# Patient Record
Sex: Female | Born: 1950 | Race: White | Hispanic: No | Marital: Married
Health system: Southern US, Community
[De-identification: ages and names within clinical notes are randomized; demographics above are authoritative.]

## PROBLEM LIST (undated history)

## (undated) DIAGNOSIS — R002 Palpitations: Secondary | ICD-10-CM

## (undated) DIAGNOSIS — E039 Hypothyroidism, unspecified: Secondary | ICD-10-CM

## (undated) HISTORY — DX: Palpitations: R00.2

## (undated) HISTORY — DX: Hypothyroidism, unspecified: E03.9

## (undated) HISTORY — PX: TONSILLECTOMY: SUR1361

---

## 2008-07-30 HISTORY — PX: VAGINAL HYSTERECTOMY: SHX2639

## 2008-07-30 HISTORY — PX: INCONTINENCE SURGERY: SHX676

## 2016-08-02 ENCOUNTER — Other Ambulatory Visit: Payer: Self-pay | Admitting: Internal Medicine

## 2016-08-02 DIAGNOSIS — Z1231 Encounter for screening mammogram for malignant neoplasm of breast: Secondary | ICD-10-CM

## 2016-09-04 ENCOUNTER — Ambulatory Visit: Payer: Self-pay | Attending: Internal Medicine

## 2017-12-10 ENCOUNTER — Encounter: Payer: Self-pay | Admitting: Obstetrics & Gynecology

## 2017-12-10 ENCOUNTER — Ambulatory Visit (INDEPENDENT_AMBULATORY_CARE_PROVIDER_SITE_OTHER): Payer: BLUE CROSS/BLUE SHIELD | Admitting: Obstetrics & Gynecology

## 2017-12-10 ENCOUNTER — Other Ambulatory Visit: Payer: Self-pay | Admitting: Obstetrics & Gynecology

## 2017-12-10 VITALS — BP 107/72 | HR 70 | Wt 123.0 lb

## 2017-12-10 DIAGNOSIS — N951 Menopausal and female climacteric states: Secondary | ICD-10-CM

## 2017-12-10 DIAGNOSIS — N644 Mastodynia: Secondary | ICD-10-CM

## 2017-12-10 DIAGNOSIS — Z1231 Encounter for screening mammogram for malignant neoplasm of breast: Secondary | ICD-10-CM

## 2017-12-10 DIAGNOSIS — Z01419 Encounter for gynecological examination (general) (routine) without abnormal findings: Secondary | ICD-10-CM | POA: Diagnosis not present

## 2017-12-10 NOTE — Progress Notes (Signed)
Last mammogram 2016 Pap smear?

## 2017-12-10 NOTE — Progress Notes (Signed)
GYNECOLOGY ANNUAL PREVENTATIVE CARE ENCOUNTER NOTE  Subjective:   Crystal Torres is a 67 y.o. G3P2013 female here for a routine annual gynecologic exam.  Current complaints: vaginal dryness which causes pain during intercourse.   Denies abnormal vaginal bleeding, discharge, pelvic pain or other gynecologic concerns.    Gynecologic History No LMP recorded. Contraception: status post hysterectomy Last mammogram: 2016. Results were: normal  Obstetric History OB History  Gravida Para Term Preterm AB Living  SAB TAB Ectopic Multiple Live Births          3    # Outcome Date GA Lbr Len/2nd Weight Sex Delivery Anes PTL Lv  3 AB           2 Term           1 Term             Past Medical History:  Diagnosis Date  . Heart palpitations    On beta blocker  . Hypothyroidism     Past Surgical History:  Procedure Laterality Date  . INCONTINENCE SURGERY  2010   Done at same time as TVH  . TONSILLECTOMY Bilateral    Done at age 73  . VAGINAL HYSTERECTOMY  2010   TVH, RSO at [facility] for benign reasons    Current Outpatient Medications on File Prior to Visit  Medication Sig Dispense Refill  . Cyanocobalamin ER (B-12 DUAL SPECTRUM) 5000 MCG TBCR Take by mouth.    . ferrous sulfate 325 (65 FE) MG tablet Take by mouth.    . levothyroxine (SYNTHROID, LEVOTHROID) 75 MCG tablet Take by mouth.    . metoprolol succinate (TOPROL-XL) 50 MG 24 hr tablet Take 50 mg by mouth daily.  3  . Potassium 99 MG TABS Take by mouth.     No current facility-administered medications on file prior to visit.     Allergies  Allergen Reactions  . Hydrocodone-Acetaminophen Other (See Comments) and Itching    Social History   Socioeconomic History  . Marital status: Married    Spouse name: Not on file  . Number of children: Not on file  . Years of education: Not on file  . Highest education level: Not on file  Occupational History  . Not on file  Social Needs  .  Financial resource strain: Not on file  . Food insecurity:    Worry: Not on file    Inability: Not on file  . Transportation needs:    Medical: Not on file    Non-medical: Not on file  Tobacco Use  . Smoking status: Not on file  Substance and Sexual Activity  . Alcohol use: Not on file  . Drug use: Not on file  . Sexual activity: Not on file  Lifestyle  . Physical activity:    Days per week: Not on file    Minutes per session: Not on file  . Stress: Not on file  Relationships  . Social connections:    Talks on phone: Not on file    Gets together: Not on file    Attends religious service: Not on file    Active member of club or organization: Not on file    Attends meetings of clubs or organizations: Not on file    Relationship status: Not on file  . Intimate partner violence:    Fear of current or ex partner: Not on file    Emotionally abused: Not on  file    Physically abused: Not on file    Forced sexual activity: Not on file  Other Topics Concern  . Not on file  Social History Narrative  . Not on file    No family history on file.  The following portions of the patient's history were reviewed and updated as appropriate: allergies, current medications, past family history, past medical history, past social history, past surgical history and problem list.  Review of Systems Pertinent items noted in HPI and remainder of comprehensive ROS otherwise negative.   Objective:  BP 107/72   Pulse 70   Wt 123 lb (55.8 kg)  CONSTITUTIONAL: Well-developed, well-nourished female in no acute distress.  HENT:  Normocephalic, atraumatic, External right and left ear normal. Oropharynx is clear and moist EYES: Conjunctivae and EOM are normal. Pupils are equal, round, and reactive to light. No scleral icterus.  NECK: Normal range of motion, supple, no masses.  Normal thyroid.  SKIN: Skin is warm and dry. No rash noted. Not diaphoretic. No erythema. No pallor. NEUROLOGIC: Alert and  oriented to person, place, and time. Normal reflexes, muscle tone coordination. No cranial nerve deficit noted. PSYCHIATRIC: Normal mood and affect. Normal behavior. Normal judgment and thought content. CARDIOVASCULAR: Normal heart rate noted, regular rhythm RESPIRATORY: Clear to auscultation bilaterally. Effort and breath sounds normal, no problems with respiration noted. BREASTS: Symmetric in size. No masses, skin changes, nipple drainage, or lymphadenopathy. ABDOMEN: Soft, normal bowel sounds, no distention noted.  No tenderness, rebound or guarding.  PELVIC: Atrophic external genitalia and vaginal mucosa and cuff.  No abnormal discharge noted. No adnexal tenderness. MUSCULOSKELETAL: Normal range of motion. No tenderness.  No cyanosis, clubbing, or edema.  2+ distal pulses.   Assessment and Plan:  1. Menopausal vaginal dryness Discussed management of this in detail.  Recommended  Topical estrogen vs Osphena vs Intrarosa. Samples of Osphena and Intrarosa given. Recommended water-based or silicone-based hypoallergenic lubrication during intercourse.  Follow up in one month.  2. Encounter for screening mammogram for breast cancer Mammogram ordered  3. Well woman exam No need for paps given hysterectomy for benign indications. Routine preventative health maintenance measures emphasized. Please refer to After Visit Summary for other counseling recommendations.    Jaynie Collins, MD, FACOG Obstetrician & Gynecologist, [facility] for [employer], [facility]

## 2017-12-10 NOTE — Patient Instructions (Signed)
Preventive Care 40-64 Years, Female Preventive care refers to lifestyle choices and visits with your health care provider that can promote health and wellness. What does preventive care include?  A yearly physical exam. This is also called an annual well check.  Dental exams once or twice a year.  Routine eye exams. Ask your health care provider how often you should have your eyes checked.  Personal lifestyle choices, including: ? Daily care of your teeth and gums. ? Regular physical activity. ? Eating a healthy diet. ? Avoiding tobacco and drug use. ? Limiting alcohol use. ? Practicing safe sex. ? Taking low-dose aspirin daily starting at age 58. ? Taking vitamin and mineral supplements as recommended by your health care provider. What happens during an annual well check? The services and screenings done by your health care provider during your annual well check will depend on your age, overall health, lifestyle risk factors, and family history of disease. Counseling Your health care provider may ask you questions about your:  Alcohol use.  Tobacco use.  Drug use.  Emotional well-being.  Home and relationship well-being.  Sexual activity.  Eating habits.  Work and work Statistician.  Method of birth control.  Menstrual cycle.  Pregnancy history.  Screening You may have the following tests or measurements:  Height, weight, and BMI.  Blood pressure.  Lipid and cholesterol levels. These may be checked every 5 years, or more frequently if you are over 81 years old.  Skin check.  Lung cancer screening. You may have this screening every year starting at age 78 if you have a 30-pack-year history of smoking and currently smoke or have quit within the past 15 years.  Fecal occult blood test (FOBT) of the stool. You may have this test every year starting at age 65.  Flexible sigmoidoscopy or colonoscopy. You may have a sigmoidoscopy every 5 years or a colonoscopy  every 10 years starting at age 30.  Hepatitis C blood test.  Hepatitis B blood test.  Sexually transmitted disease (STD) testing.  Diabetes screening. This is done by checking your blood sugar (glucose) after you have not eaten for a while (fasting). You may have this done every 1-3 years.  Mammogram. This may be done every 1-2 years. Talk to your health care provider about when you should start having regular mammograms. This may depend on whether you have a family history of breast cancer.  BRCA-related cancer screening. This may be done if you have a family history of breast, ovarian, tubal, or peritoneal cancers.  Pelvic exam and Pap test. This may be done every 3 years starting at age 80. Starting at age 36, this may be done every 5 years if you have a Pap test in combination with an HPV test.  Bone density scan. This is done to screen for osteoporosis. You may have this scan if you are at high risk for osteoporosis.  Discuss your test results, treatment options, and if necessary, the need for more tests with your health care provider. Vaccines Your health care provider may recommend certain vaccines, such as:  Influenza vaccine. This is recommended every year.  Tetanus, diphtheria, and acellular pertussis (Tdap, Td) vaccine. You may need a Td booster every 10 years.  Varicella vaccine. You may need this if you have not been vaccinated.  Zoster vaccine. You may need this after age 5.  Measles, mumps, and rubella (MMR) vaccine. You may need at least one dose of MMR if you were born in  1955 or later. You may also need a second dose.  Pneumococcal 13-valent conjugate (PCV13) vaccine. You may need this if you have certain conditions and were not previously vaccinated.  Pneumococcal polysaccharide (PPSV23) vaccine. You may need one or two doses if you smoke cigarettes or if you have certain conditions.  Meningococcal vaccine. You may need this if you have certain  conditions.  Hepatitis A vaccine. You may need this if you have certain conditions or if you travel or work in places where you may be exposed to hepatitis A.  Hepatitis B vaccine. You may need this if you have certain conditions or if you travel or work in places where you may be exposed to hepatitis B.  Haemophilus influenzae type b (Hib) vaccine. You may need this if you have certain conditions.  Talk to your health care provider about which screenings and vaccines you need and how often you need them. This information is not intended to replace advice given to you by your health care provider. Make sure you discuss any questions you have with your health care provider. Document Released: 08/12/2015 Document Revised: 04/04/2016 Document Reviewed: 05/17/2015 Elsevier Interactive Patient Education  2018 [employer]  

## 2017-12-16 ENCOUNTER — Ambulatory Visit
Admission: RE | Admit: 2017-12-16 | Discharge: 2017-12-16 | Disposition: A | Discharge status: Home / Self Care | Payer: PRIVATE HEALTH INSURANCE | Source: Ambulatory Visit | Attending: Obstetrics & Gynecology | Admitting: Obstetrics & Gynecology

## 2017-12-16 ENCOUNTER — Ambulatory Visit: Payer: PRIVATE HEALTH INSURANCE

## 2017-12-16 DIAGNOSIS — N644 Mastodynia: Secondary | ICD-10-CM

## 2017-12-16 IMAGING — MG DIGITAL DIAGNOSTIC BILATERAL MAMMOGRAM WITH TOMO AND CAD
8 series · 9 of 24 positions shown · non-contrast
Comparison: Previous exam(s).

CLINICAL DATA: [Y4] old female with chronic
discomfort/tingling/pins and needle sensation in the lateral left
breast and surrounding the left nipple. The patient states this
sensation/pain initially started after an episode of mastitis nearly
30 years ago.

EXAM:
DIGITAL DIAGNOSTIC BILATERAL MAMMOGRAM WITH CAD AND TOMO

[R MLO synth-2D]
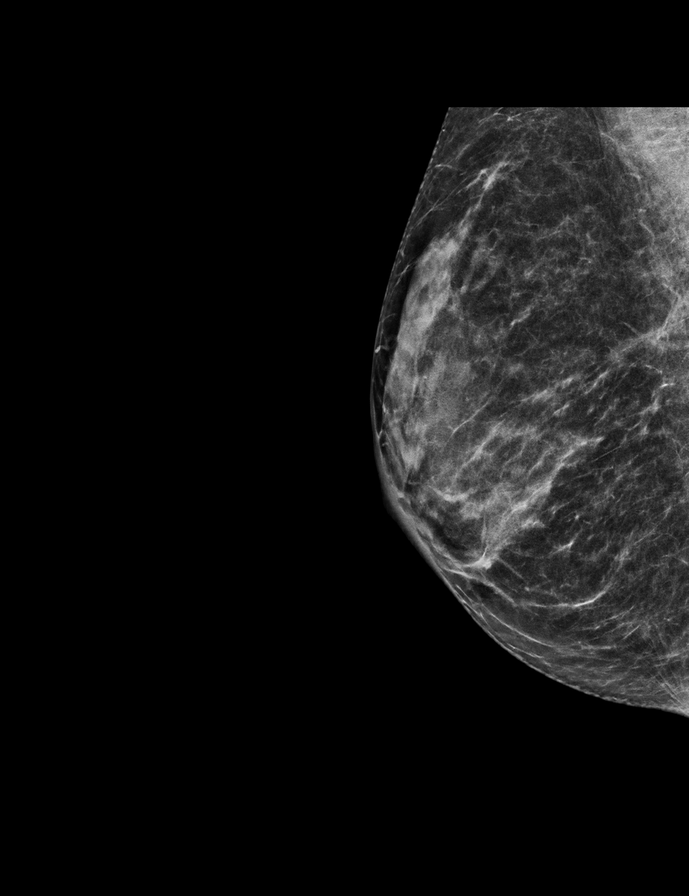

[R CC synth-2D]
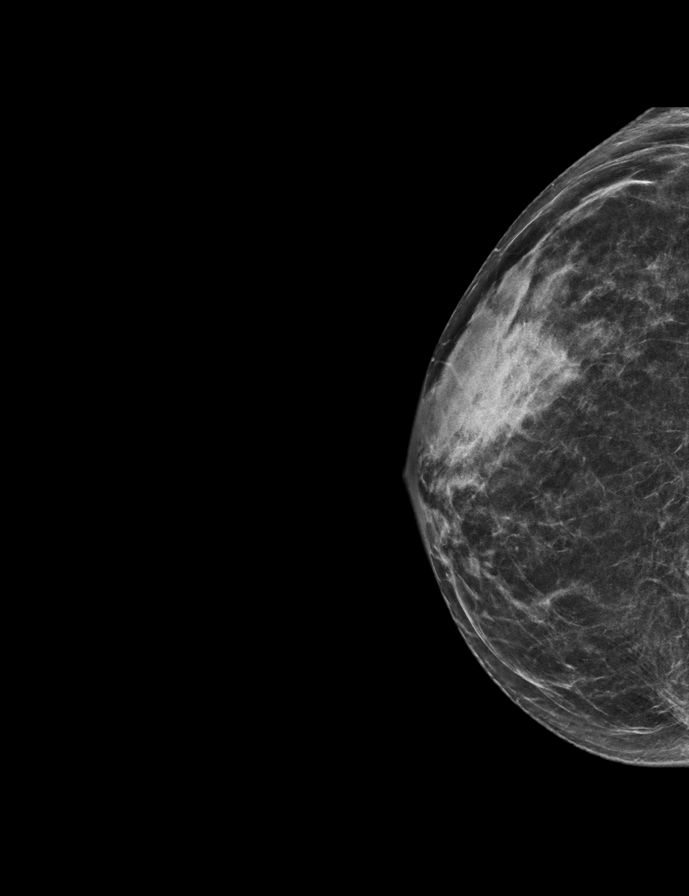

[L CC synth-2D]
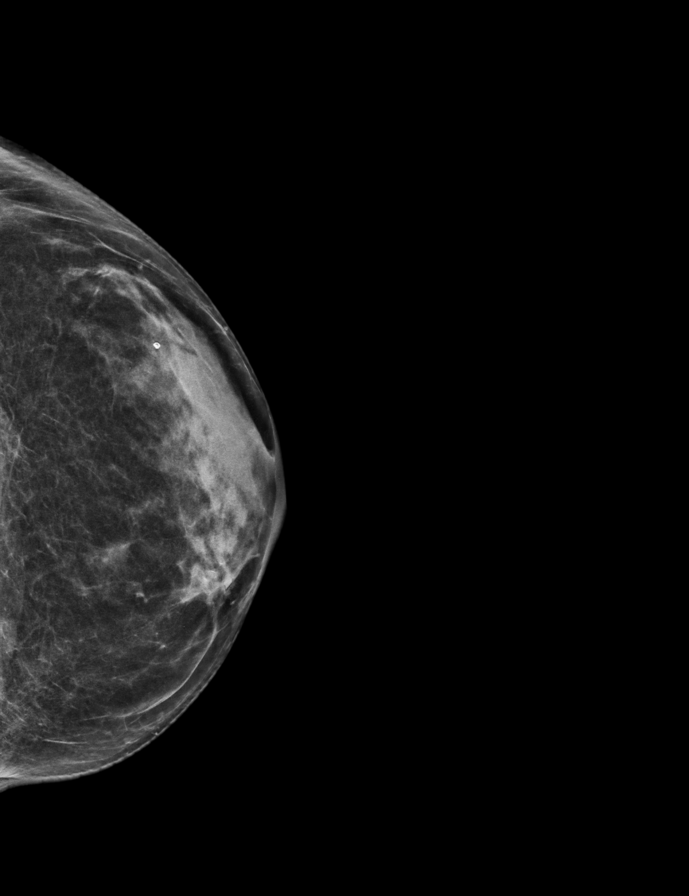

[L MLO synth-2D]
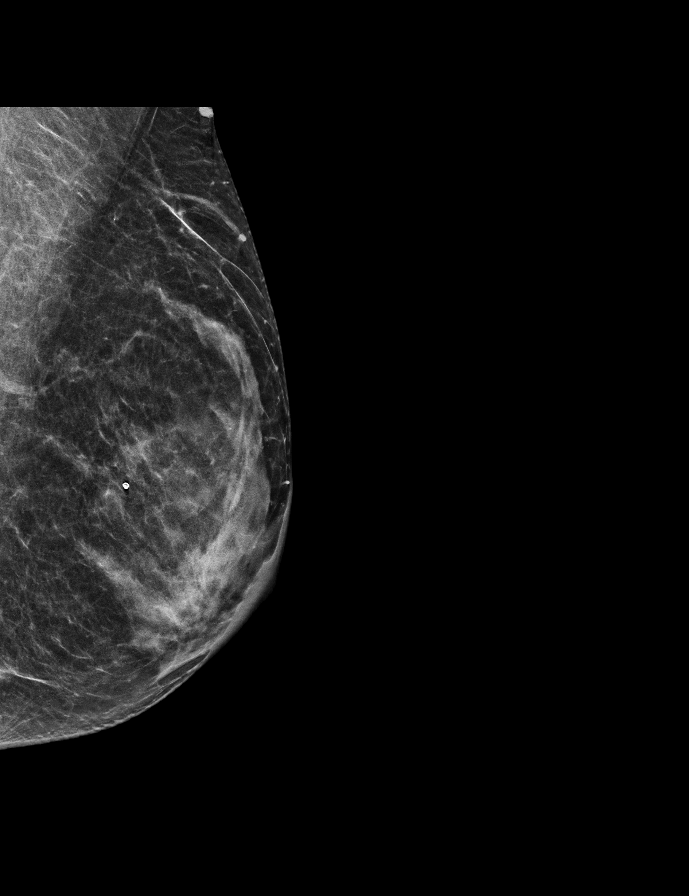

[R MLO tomo · 2 of 50 frames shown]
[frame 17/50]
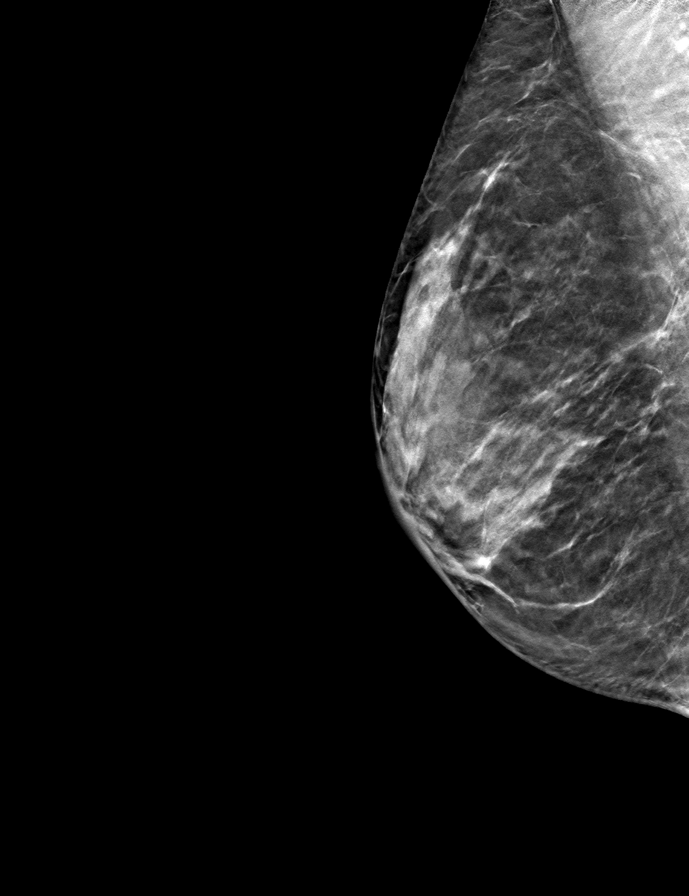
[frame 25/50]
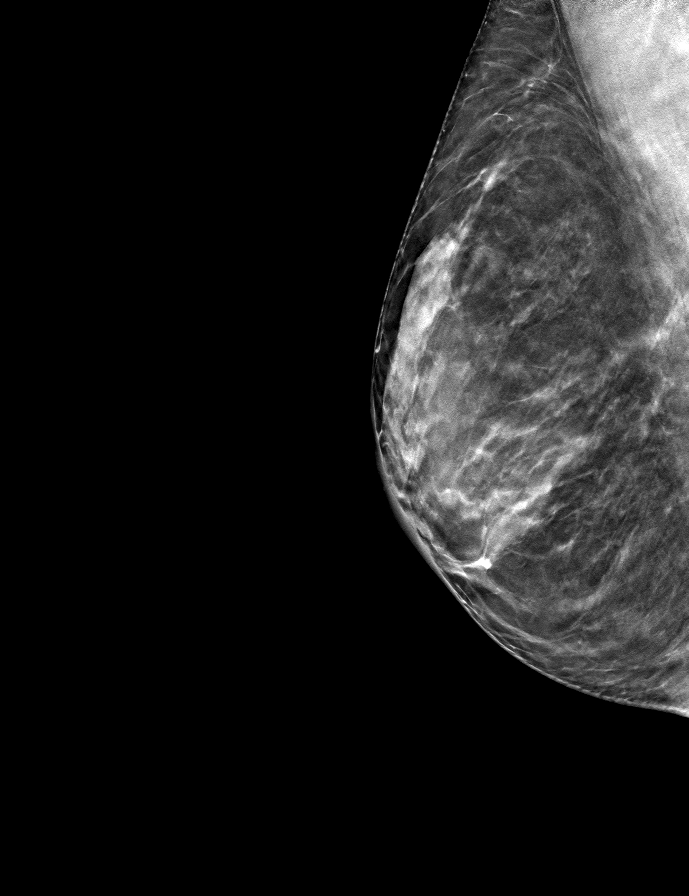

[L CC tomo · tomo slice 27/53.0]
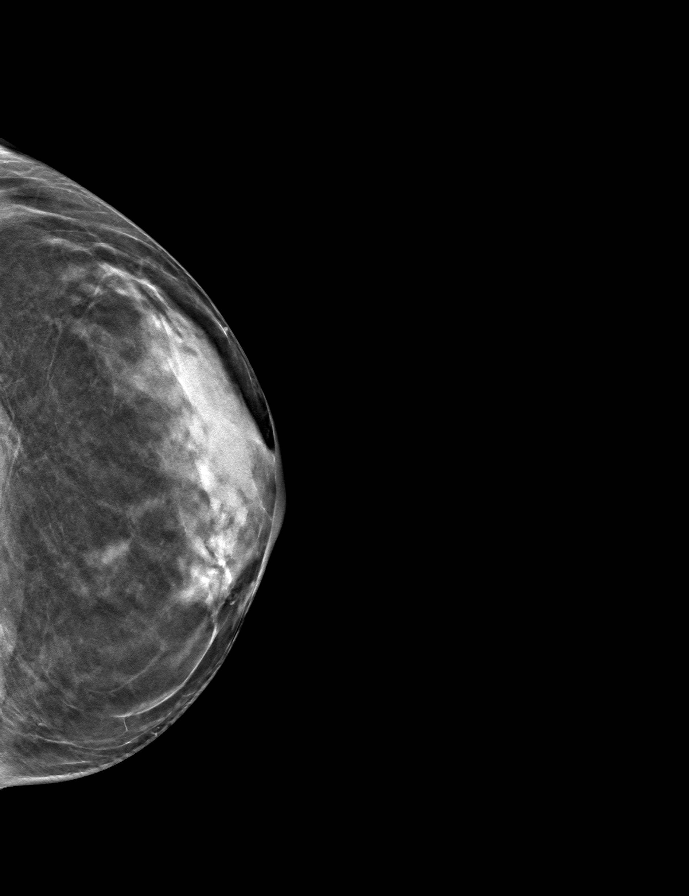

[R CC tomo · tomo slice 27/53.0]
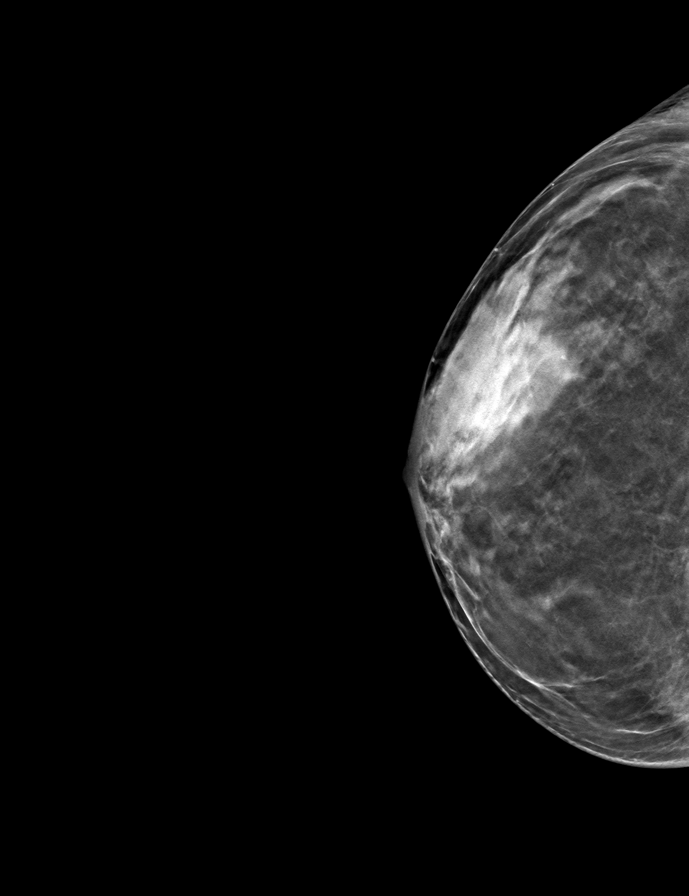

[L MLO tomo · tomo slice 25/50.0]
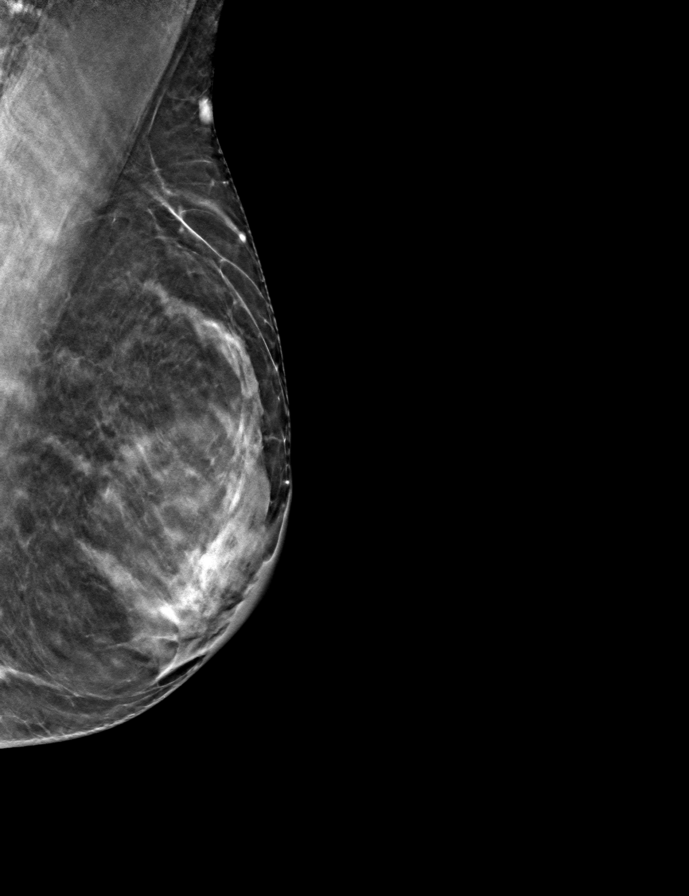

[9 of 24 positions shown; findings below may reference images not displayed]

ACR Breast Density Category c: The breast tissue is heterogeneously
dense, which may obscure small masses.
FINDINGS: No suspicious masses or calcifications are seen in either breast.
There is no mammographic evidence of malignancy in either breast.

Mammographic images were processed with CAD.
IMPRESSION: No mammographic evidence of malignancy in either breast.

RECOMMENDATION:
1. recommend further evaluation of the left breast pain/discomfort
be based on clinical assessment.

2.  Screening mammogram in one year.(Code:[Y2])

I have discussed the findings and recommendations with the patient.
Results were also provided in writing at the conclusion of the
visit. If applicable, a reminder letter will be sent to the patient
regarding the next appointment.

BI-RADS CATEGORY  1: Negative.

## 2018-10-07 ENCOUNTER — Encounter: Payer: Self-pay | Admitting: Radiology

## 2021-04-14 ENCOUNTER — Other Ambulatory Visit: Payer: Self-pay | Admitting: Family Medicine

## 2021-04-14 DIAGNOSIS — Z1231 Encounter for screening mammogram for malignant neoplasm of breast: Secondary | ICD-10-CM

## 2021-07-17 ENCOUNTER — Other Ambulatory Visit: Payer: Self-pay | Admitting: Physician Assistant

## 2021-07-17 DIAGNOSIS — R140 Abdominal distension (gaseous): Secondary | ICD-10-CM

## 2021-07-17 DIAGNOSIS — R1032 Left lower quadrant pain: Secondary | ICD-10-CM

## 2021-07-19 ENCOUNTER — Other Ambulatory Visit: Payer: Self-pay | Admitting: Physician Assistant

## 2021-07-19 DIAGNOSIS — Z1231 Encounter for screening mammogram for malignant neoplasm of breast: Secondary | ICD-10-CM

## 2021-08-08 ENCOUNTER — Ambulatory Visit: Admission: RE | Admit: 2021-08-08 | Payer: Medicare PPO | Source: Ambulatory Visit

## 2021-08-23 ENCOUNTER — Ambulatory Visit
Admission: RE | Admit: 2021-08-23 | Discharge: 2021-08-23 | Disposition: A | Discharge status: Home / Self Care | Payer: Medicare PPO | Source: Ambulatory Visit | Attending: Physician Assistant | Admitting: Physician Assistant

## 2021-08-23 ENCOUNTER — Other Ambulatory Visit: Payer: Self-pay

## 2021-08-23 DIAGNOSIS — R1032 Left lower quadrant pain: Secondary | ICD-10-CM | POA: Diagnosis present

## 2021-08-23 DIAGNOSIS — R140 Abdominal distension (gaseous): Secondary | ICD-10-CM | POA: Insufficient documentation

## 2021-08-23 LAB — POCT I-STAT CREATININE: Creatinine, Ser: 1 mg/dL (ref 0.44–1.00)

## 2021-08-23 MED ORDER — IOHEXOL 300 MG/ML  SOLN
100.0000 mL | Freq: Once | INTRAMUSCULAR | Status: AC | PRN
  Administered 2021-08-23: 08:00:00 100 mL via INTRAVENOUS

## 2021-09-20 ENCOUNTER — Other Ambulatory Visit: Payer: Self-pay | Admitting: Physical Medicine & Rehabilitation

## 2021-09-20 DIAGNOSIS — M5442 Lumbago with sciatica, left side: Secondary | ICD-10-CM

## 2021-09-20 DIAGNOSIS — G8929 Other chronic pain: Secondary | ICD-10-CM

## 2021-09-20 DIAGNOSIS — M542 Cervicalgia: Secondary | ICD-10-CM

## 2021-10-02 ENCOUNTER — Other Ambulatory Visit: Payer: Self-pay

## 2021-10-02 ENCOUNTER — Ambulatory Visit
Admission: RE | Admit: 2021-10-02 | Discharge: 2021-10-02 | Disposition: A | Discharge status: Home / Self Care | Payer: Medicare PPO | Source: Ambulatory Visit | Attending: Physical Medicine & Rehabilitation | Admitting: Physical Medicine & Rehabilitation

## 2021-10-02 DIAGNOSIS — M542 Cervicalgia: Secondary | ICD-10-CM

## 2021-10-02 DIAGNOSIS — M5442 Lumbago with sciatica, left side: Secondary | ICD-10-CM | POA: Insufficient documentation

## 2021-10-02 DIAGNOSIS — G8929 Other chronic pain: Secondary | ICD-10-CM | POA: Diagnosis present

## 2021-10-02 IMAGING — MR MR CERVICAL SPINE W/O CM
5 series · 38 of 48 positions shown · non-contrast
Comparison: None.

CLINICAL DATA: Chronic neck pain without radiation to the arms.

EXAM:
MRI CERVICAL SPINE WITHOUT CONTRAST
TECHNIQUE: Multiplanar, multisequence MR imaging of the cervical spine was
performed. No intravenous contrast was administered.

[Series 5: T2 · sagittal · 3.0mm · 0.62mm/px · 8 of 15 slices shown (1 of 2)]
[im 1/15]
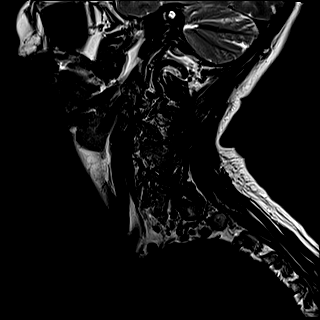
[im 3/15]
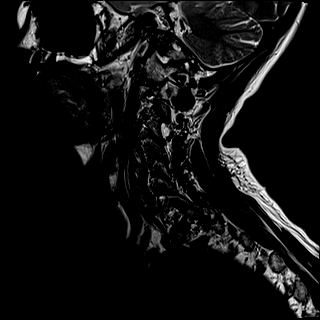
[im 5/15]
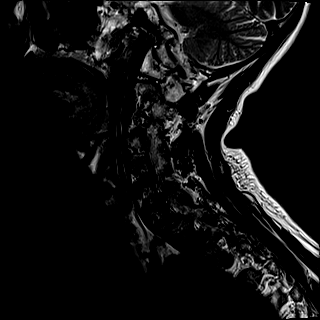
[im 7/15]
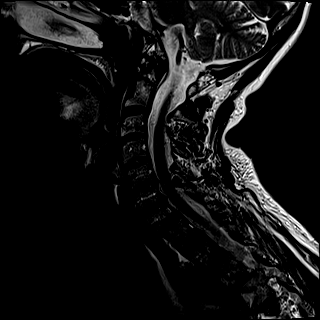
[im 9/15]
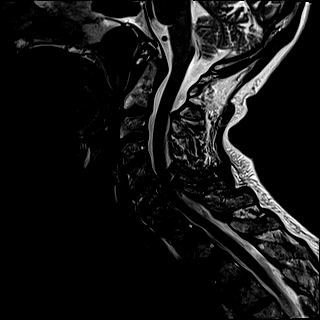
[im 11/15]
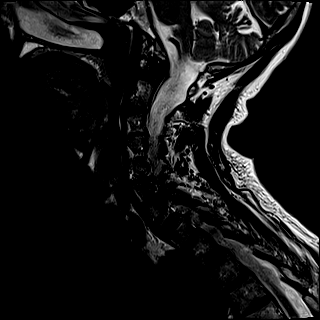
[im 13/15]
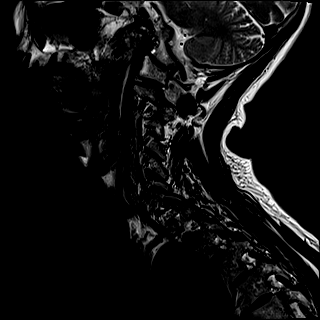
[im 15/15]
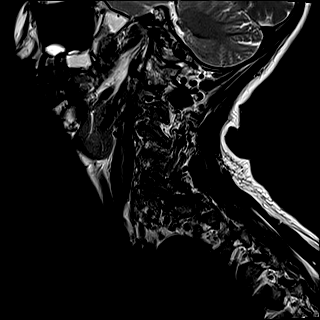

[Series 6: FLAIR · sagittal · 3.0mm · 0.78mm/px · 7 of 15 slices shown]
[im 1/15]
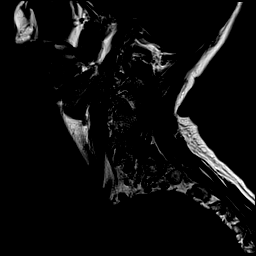
[im 3/15]
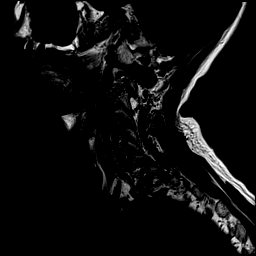
[im 5/15]
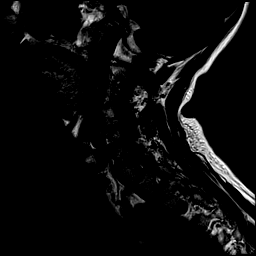
[im 8/15]
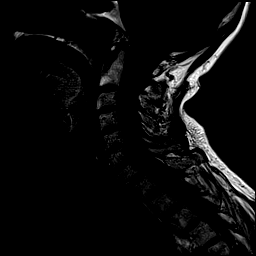
[im 10/15]
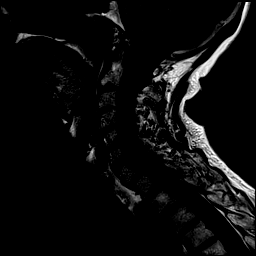
[im 12/15]
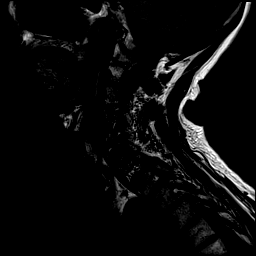
[im 15/15]
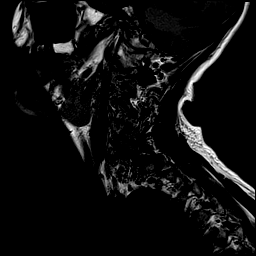

[Series 7: STIR · sagittal · 3.0mm · 0.62mm/px · 7 of 15 slices shown]
[im 1/15]
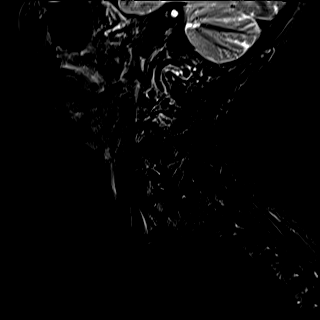
[im 3/15]
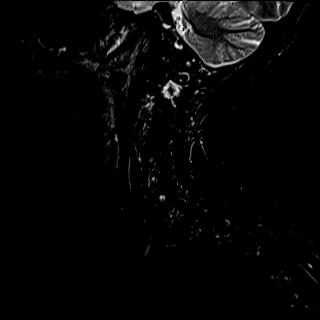
[im 5/15]
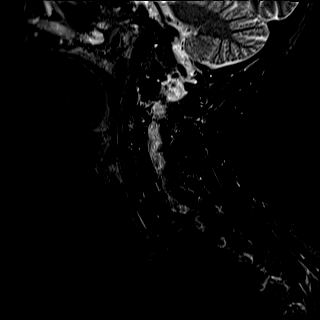
[im 8/15]
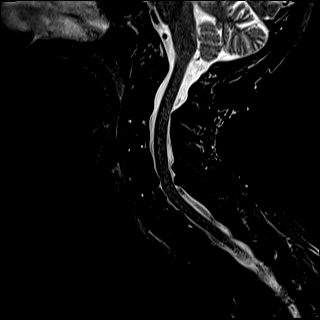
[im 10/15]
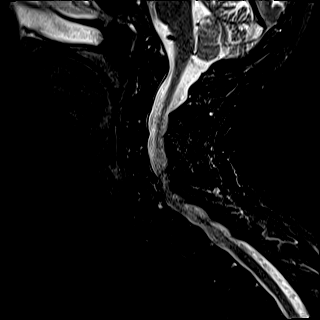
[im 12/15]
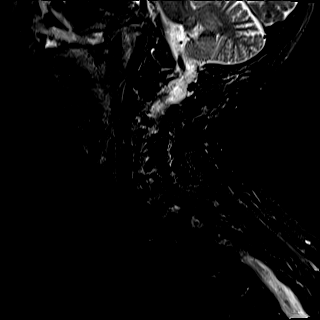
[im 15/15]
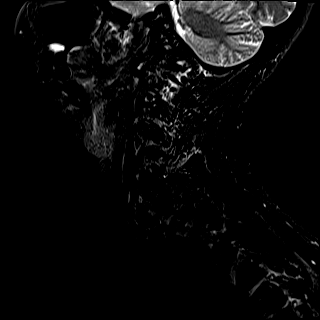

[Series 8: T2 · axial · 3.0mm · 0.70mm/px · z∈[-123,-37]mm · 9 of 27 slices shown (2 of 2)]
[im 1/27]
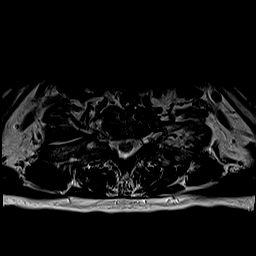
[im 5/27]
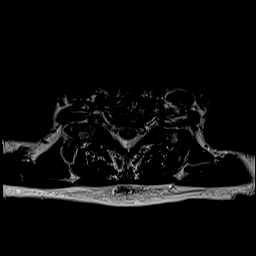
[im 9/27]
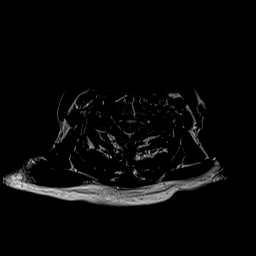
[im 11/27]
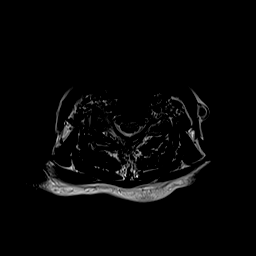
[im 14/27]
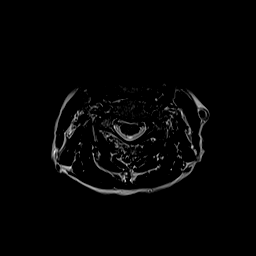
[im 16/27]
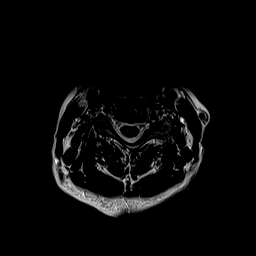
[im 18/27]
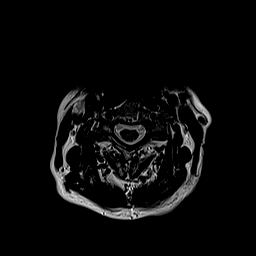
[im 22/27]
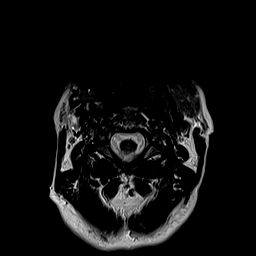
[im 27/27]
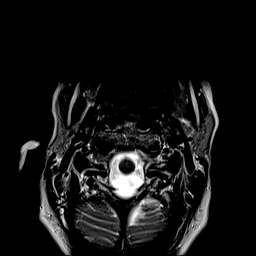

[Series 9: ax mpgr · axial · 3.0mm · 0.35mm/px · z∈[-123,-53]mm · 7 of 27 slices shown]
[im 1/27]
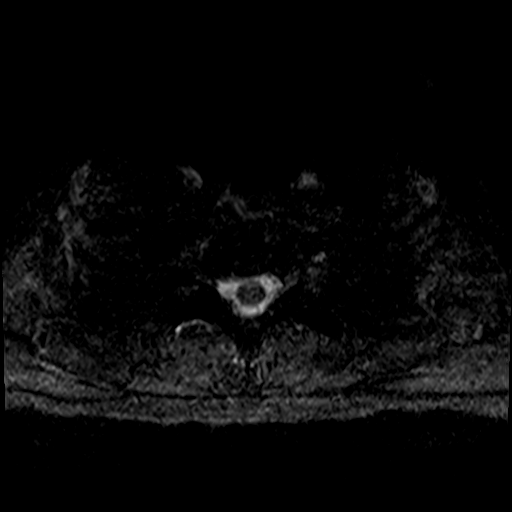
[im 5/27]
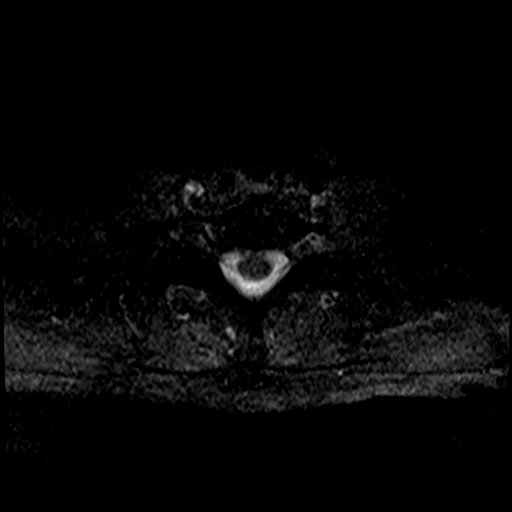
[im 9/27]
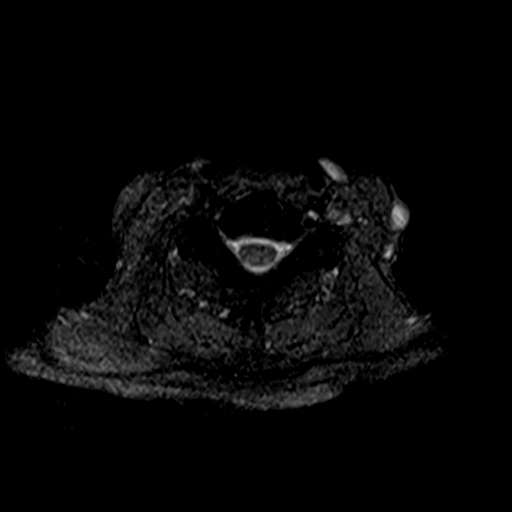
[im 11/27]
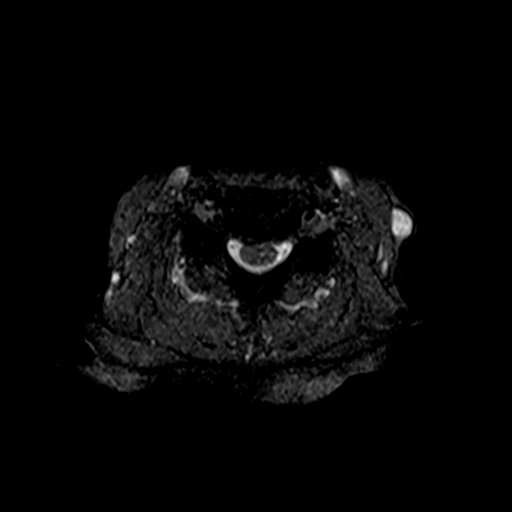
[im 16/27]
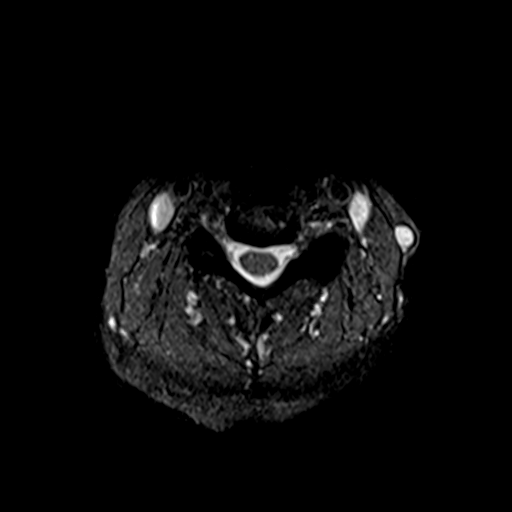
[im 18/27]
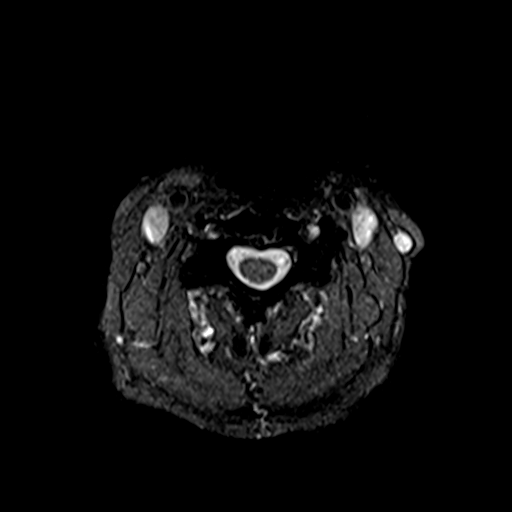
[im 22/27]
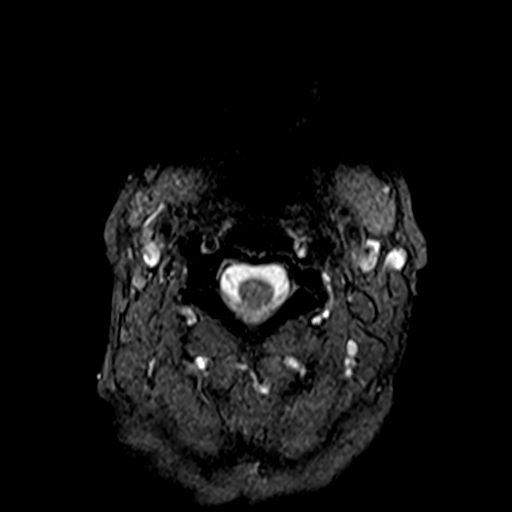

[38 of 48 positions shown; findings below may reference images not displayed]

FINDINGS: Alignment: Normal except for 1 or 2 mm of anterolisthesis at C7-T1.

Vertebrae: No fracture or focal bone lesion.

Cord: No cord compression or focal cord lesion.

Posterior Fossa, vertebral arteries, paraspinal tissues: Normal

Disc levels:

Foramen magnum, C1-2 and C2-3 are normal.

C3-4: Mild uncovertebral prominence. Mild facet degeneration. Mild
foraminal narrowing, not likely compressive.

C4-5: Chronic fusion across the left facet joint. No canal or
foraminal stenosis.

C5-6: Chronic spondylosis with endplate osteophytes and bulging of
the disc slightly more prominent towards the right. Mild facet and
ligamentous hypertrophy. Narrowing of the ventral subarachnoid space
but no compression of cord. AP diameter of the canal in the midline
1 cm. Mild bilateral foraminal narrowing.

C6-7: Spondylosis with endplate osteophytes and bulging of the disc.
Mild facet and ligamentous hypertrophy. Narrowing of the ventral
subarachnoid space but no compression of cord. AP diameter of the
canal 11 mm. Moderate bilateral foraminal narrowing.

C7-T1: Facet osteoarthritis on the right 1 or 2 mm of
anterolisthesis. Mild bulging of the disc. No compressive stenosis.
IMPRESSION: Degenerative spondylosis at C5-6 and C6-7. No compressive canal
stenosis. Bilateral foraminal narrowing, more marked at C6-7 than
C5-6 could possibly be symptomatic

Chronic facet fusion on the left at C4-5.

## 2021-10-02 IMAGING — MR MR LUMBAR SPINE W/O CM
5 series · 31 of 48 positions shown · non-contrast
Comparison: None.

CLINICAL DATA: Chronic left-sided low back pain with left-sided
sciatica M54.42, [HZ] ([HZ]-CM).

EXAM:
MRI LUMBAR SPINE WITHOUT CONTRAST
TECHNIQUE: Multiplanar, multisequence MR imaging of the lumbar spine was
performed. No intravenous contrast was administered.

[Series 5: T2 · sagittal · 4.0mm · 0.81mm/px · 6 of 17 slices shown (1 of 2)]
[im 1/17]
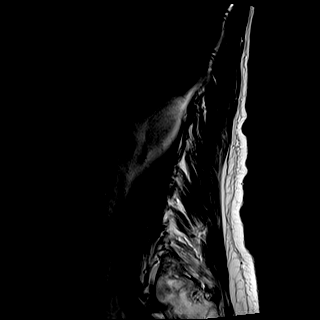
[im 4/17]
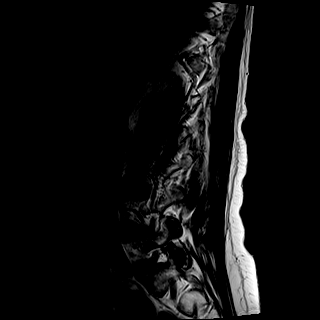
[im 7/17]
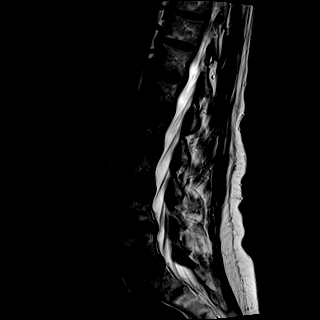
[im 10/17]
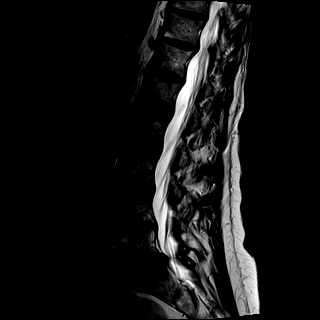
[im 13/17]
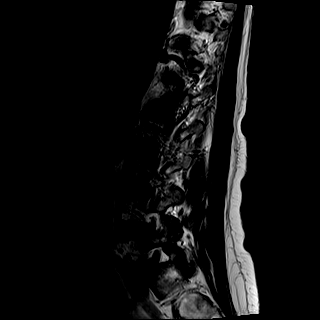
[im 17/17]
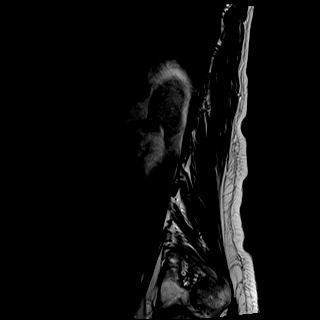

[Series 6: T1 · sagittal · 4.0mm · 0.81mm/px · 7 of 17 slices shown (1 of 2)]
[im 1/17]
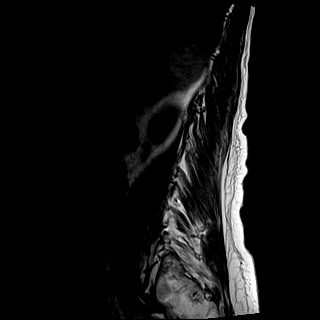
[im 3/17]
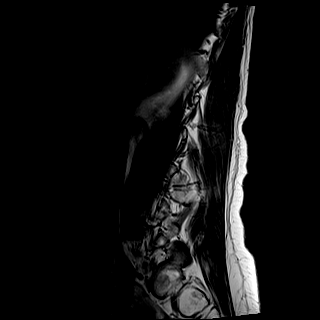
[im 6/17]
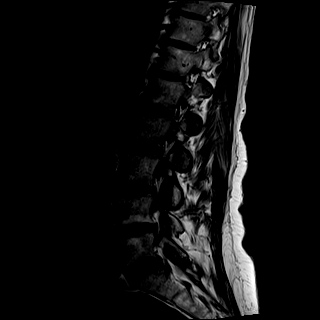
[im 9/17]
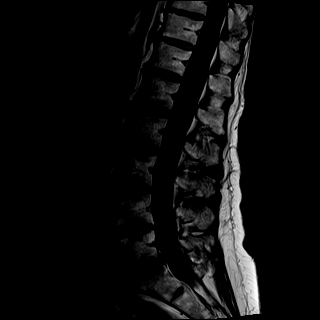
[im 11/17]
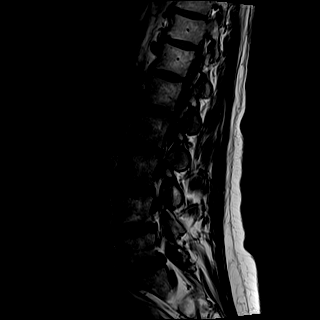
[im 14/17]
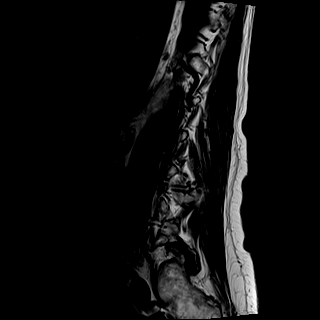
[im 17/17]
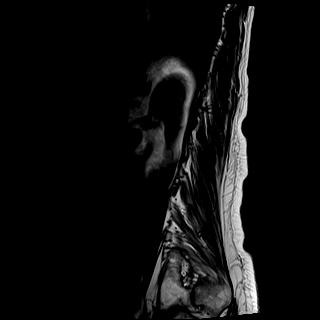

[Series 7: STIR · sagittal · 4.0mm · 0.41mm/px · 2 of 17 slices shown]
[im 1/17]
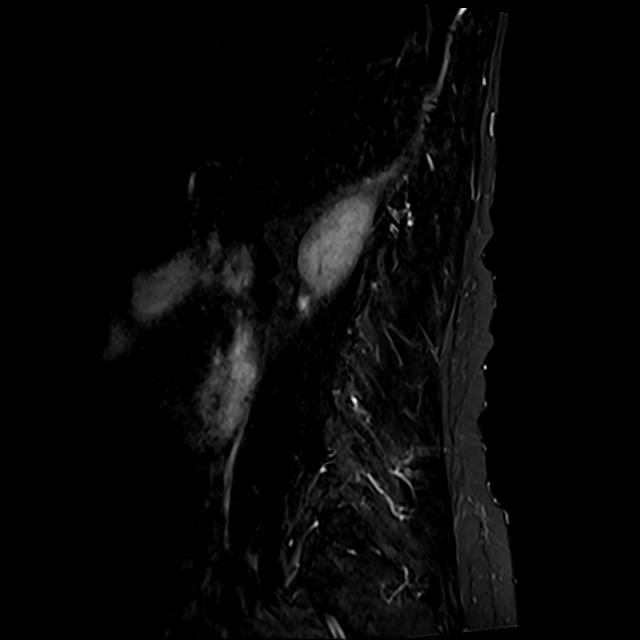
[im 3/17]
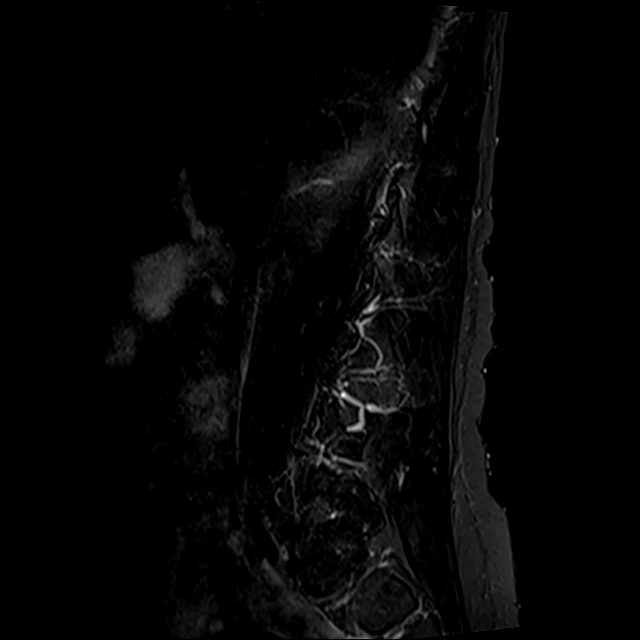

[Series 8: T2 · axial · 4.0mm · 0.78mm/px · z∈[-155,+59]mm · 8 of 37 slices shown (2 of 2)]
[im 1/37]
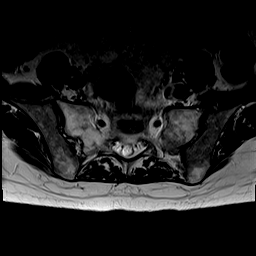
[im 6/37]
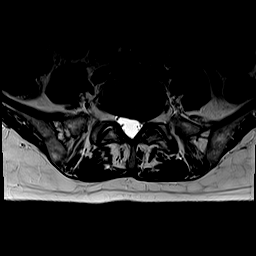
[im 12/37]
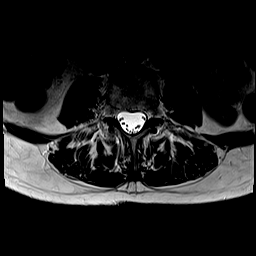
[im 17/37]
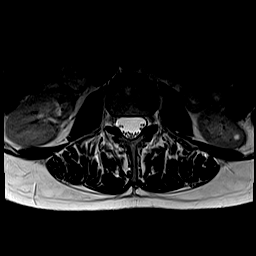
[im 20/37]
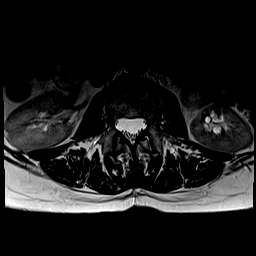
[im 25/37]
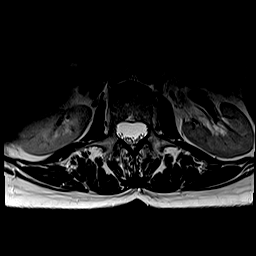
[im 31/37]
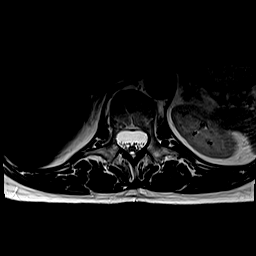
[im 37/37]
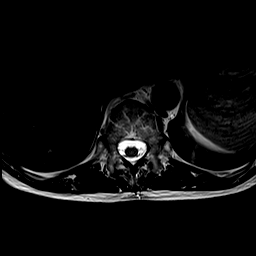

[Series 9: T1 · axial · 4.0mm · 0.39mm/px · z∈[-155,+59]mm · 8 of 37 slices shown (2 of 2)]
[im 1/37]
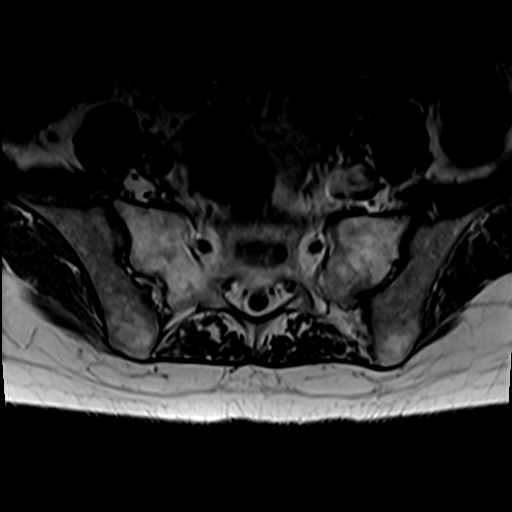
[im 6/37]
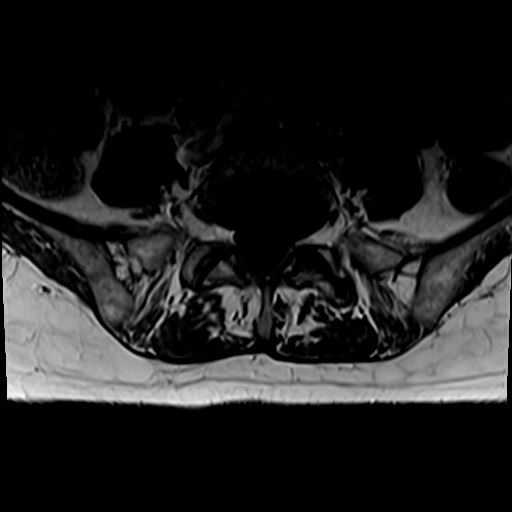
[im 12/37]
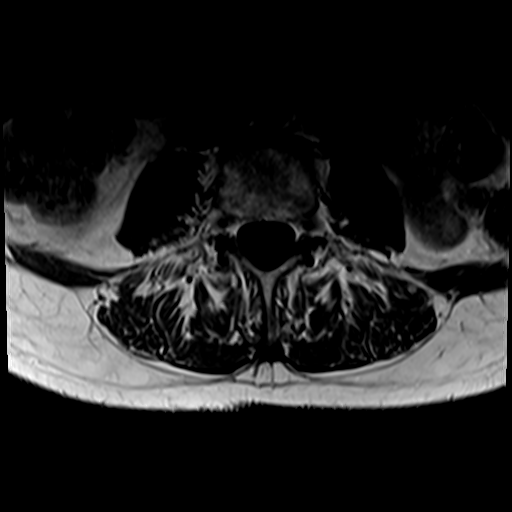
[im 17/37]
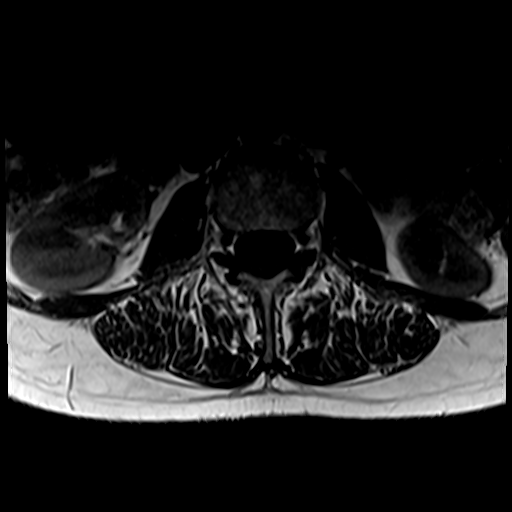
[im 20/37]
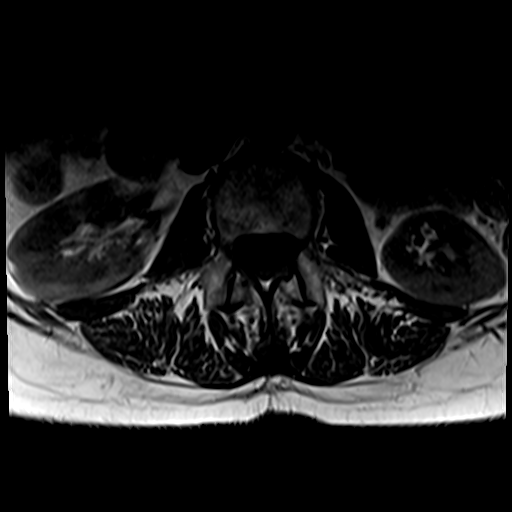
[im 25/37]
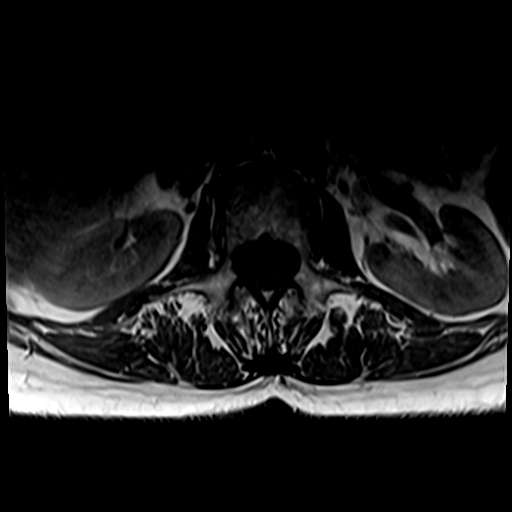
[im 31/37]
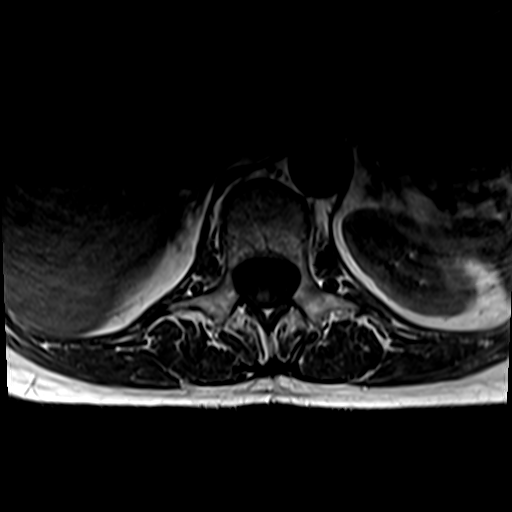
[im 37/37]
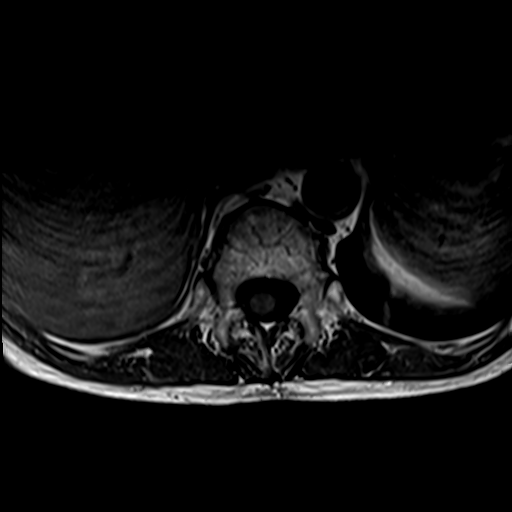

[31 of 48 positions shown; findings below may reference images not displayed]

FINDINGS: Segmentation:  Standard.

Alignment:  Physiologic.

Vertebrae:  No fracture, evidence of discitis, or bone lesion.

Conus medullaris and cauda equina: Conus extends to the L1-2 level.
Conus and cauda equina appear normal.

Paraspinal and other soft tissues: Negative.

Disc levels:

T11-12 and T12-L1: Small posterior disc protrusions. No significant
spinal canal or neural foraminal stenosis.

L1-2: No spinal canal or neural foraminal stenosis.

L2-3: No spinal canal or neural foraminal stenosis.

L3-4: Shallow disc bulge and mild facet degenerative changes without
significant spinal canal or neural foraminal stenosis.

L4-5: Loss of disc height, shallow disc bulge and mild facet
degenerative changes resulting in mild bilateral neural foraminal
narrowing. No significant spinal canal stenosis.

L5-S1: Shallow disc bulge with superimposed small left
subarticular/foraminal disc protrusion causing mild displacement of
the traversing left S1 nerve root and abutting the exiting left L5
nerve root in the neural foramen. Mild right and moderate left
hypertrophic left facet degenerative changes. Moderate left neural
foraminal narrowing.
IMPRESSION: 1. Left subarticular/foraminal disc protrusion and left facet
hypertrophy at L5-S1 resulting in narrowing of the left subarticular
zone and moderate left neural foraminal narrowing, may impinge on
the traversing left S1 nerve root and exiting left L5 nerve root,
respectively.
2. Degenerative disc disease at L5-S1 and mild facet arthropathy
resulting in mild bilateral neural foraminal narrowing.

## 2021-10-17 ENCOUNTER — Other Ambulatory Visit: Payer: Self-pay

## 2021-10-17 ENCOUNTER — Ambulatory Visit
Admission: RE | Admit: 2021-10-17 | Discharge: 2021-10-17 | Disposition: A | Discharge status: Home / Self Care | Payer: Medicare PPO | Source: Ambulatory Visit | Attending: Physician Assistant | Admitting: Physician Assistant

## 2021-10-17 DIAGNOSIS — Z1231 Encounter for screening mammogram for malignant neoplasm of breast: Secondary | ICD-10-CM | POA: Diagnosis present

## 2021-10-17 IMAGING — MG MM DIGITAL SCREENING BILAT W/ TOMO AND CAD
8 series · 9 of 24 positions shown · non-contrast
Comparison: Previous exam(s).

CLINICAL DATA: Screening.

EXAM:
DIGITAL SCREENING BILATERAL MAMMOGRAM WITH TOMOSYNTHESIS AND CAD
TECHNIQUE: Bilateral screening digital craniocaudal and mediolateral oblique
mammograms were obtained. Bilateral screening digital breast
tomosynthesis was performed. The images were evaluated with
computer-aided detection.

[L CC synth-2D]
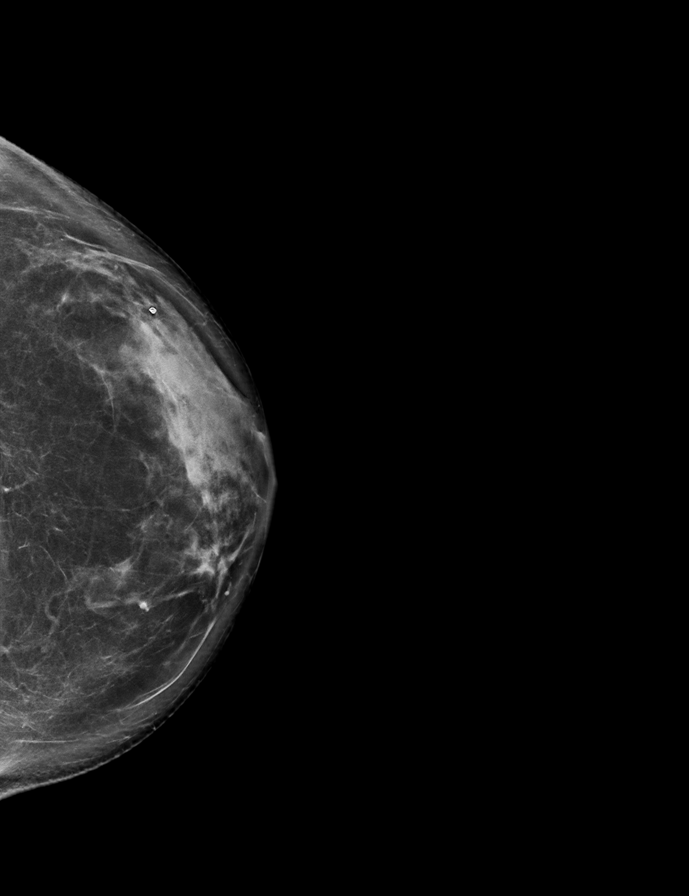

[R CC synth-2D]
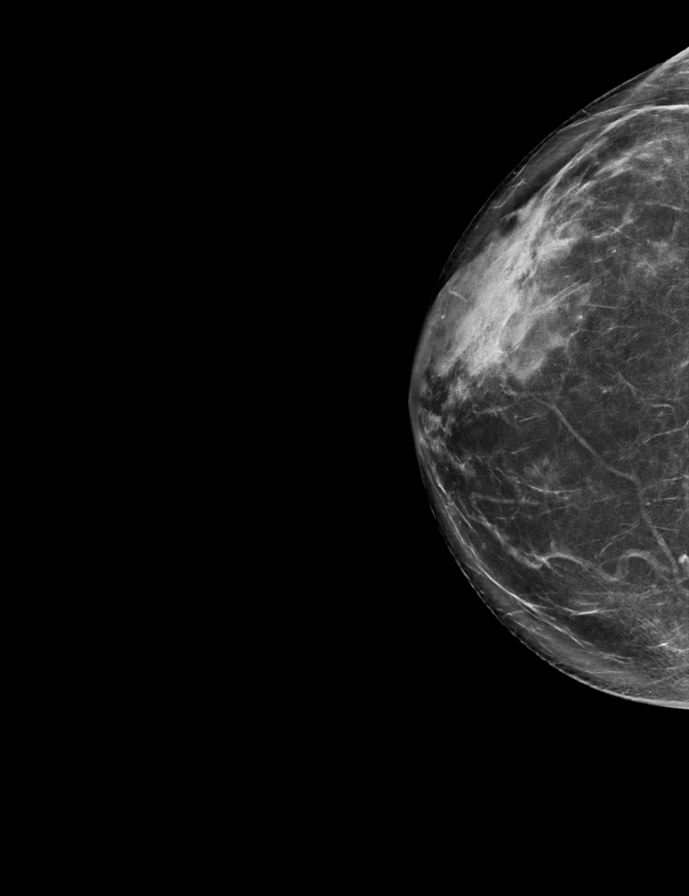

[R MLO synth-2D]
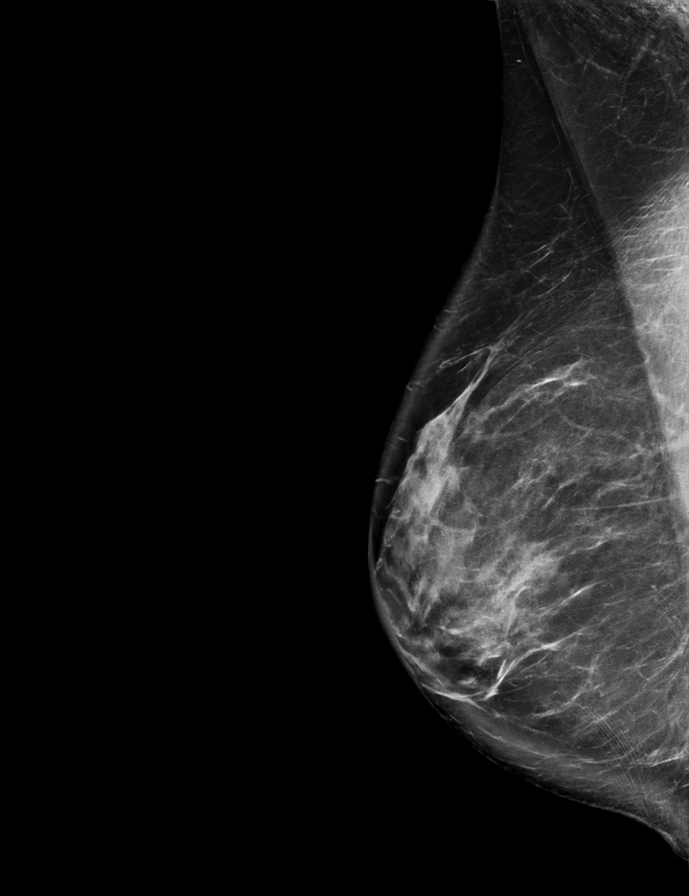

[L MLO synth-2D]
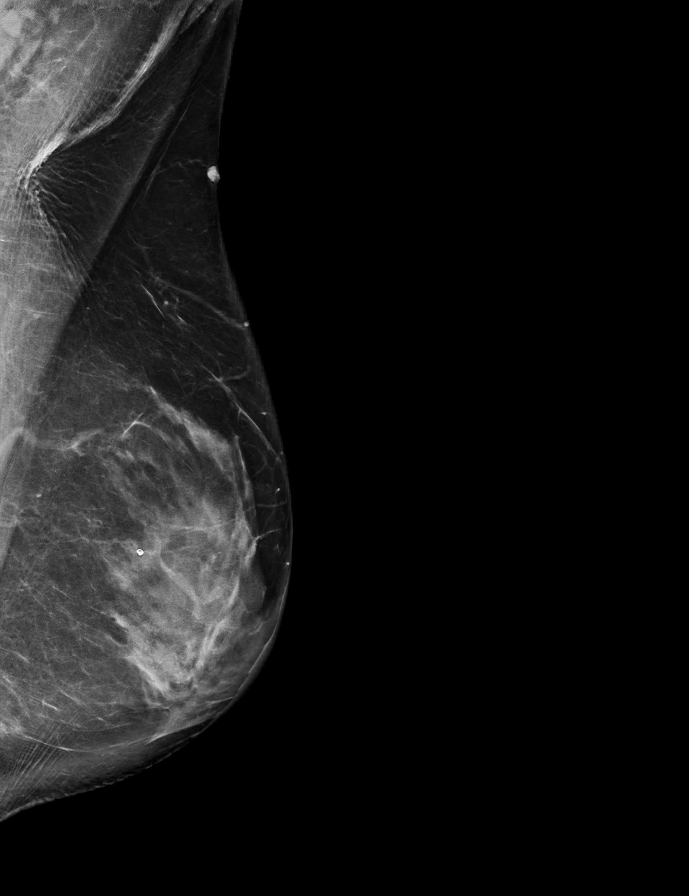

[L CC tomo · 2 of 82 frames shown]
[frame 27/82]
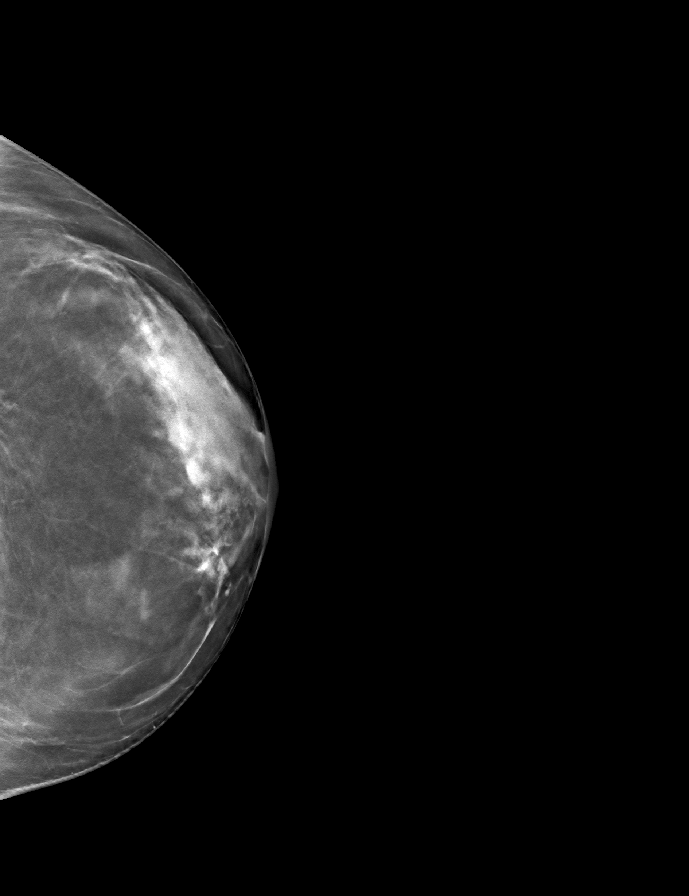
[frame 41/82]
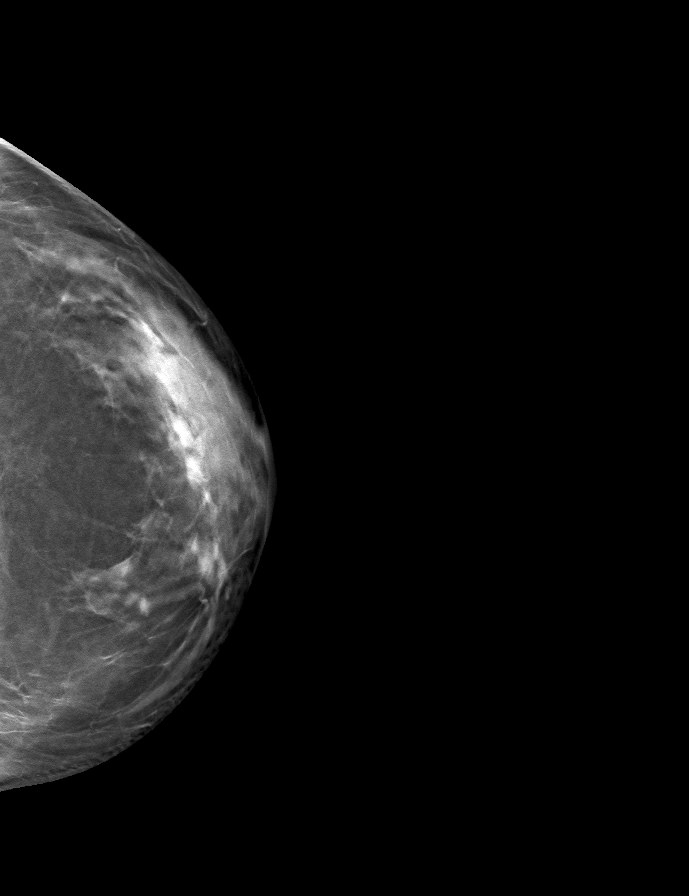

[R MLO tomo · tomo slice 45/90.0]
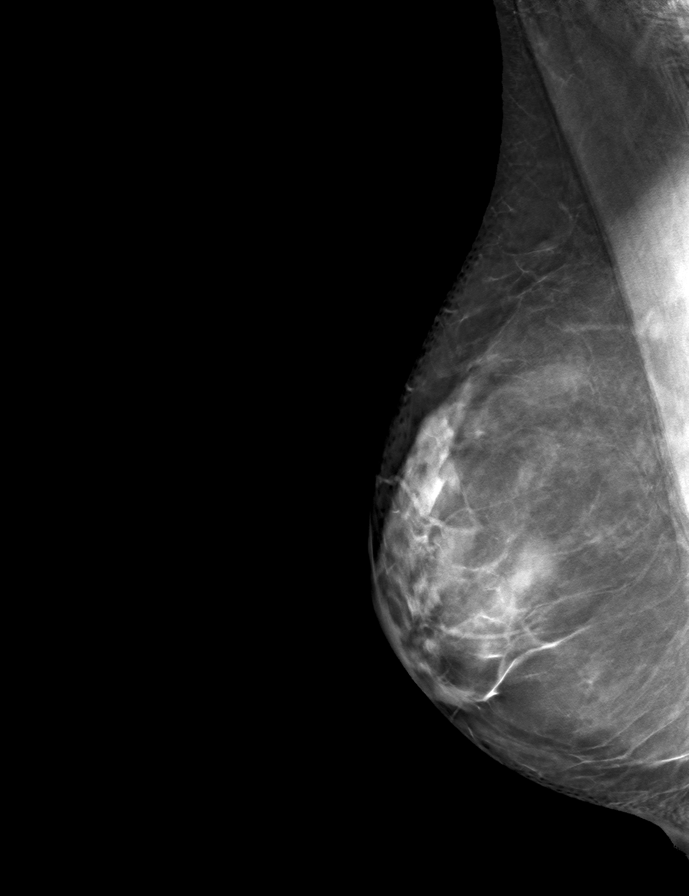

[L MLO tomo · tomo slice 43/86.0]
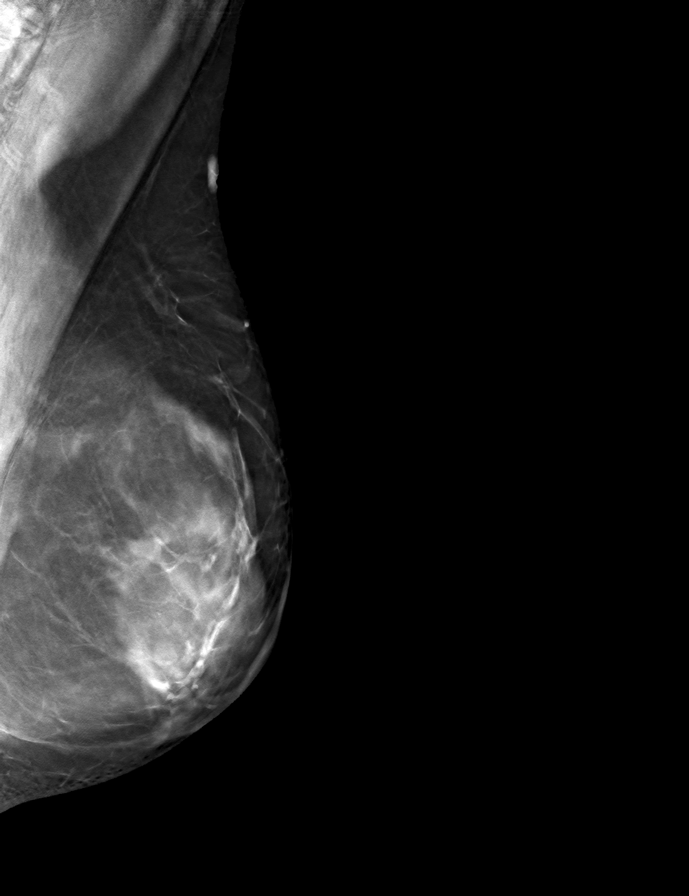

[R CC tomo · tomo slice 39/77.0]
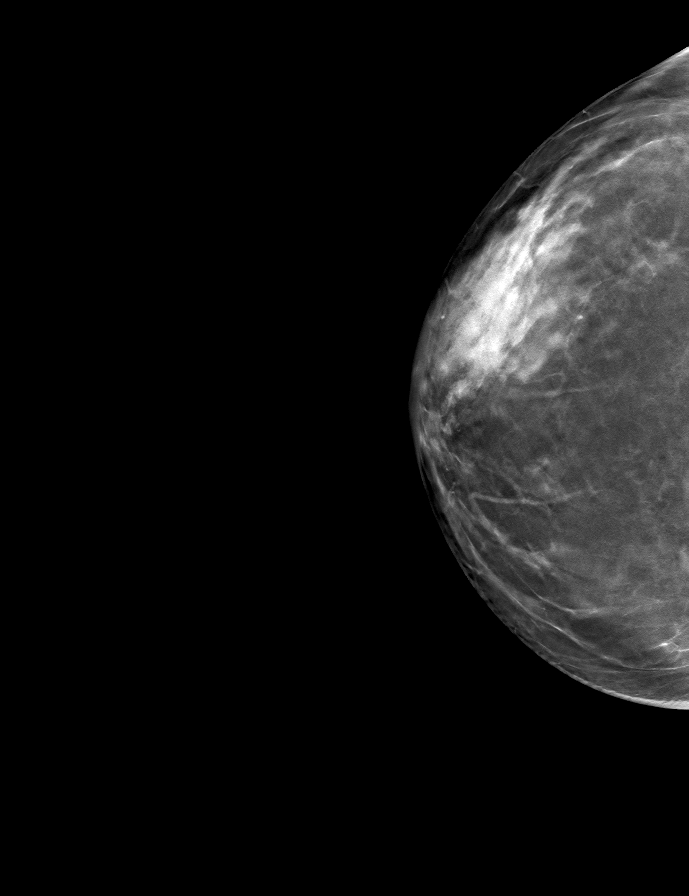

[9 of 24 positions shown; findings below may reference images not displayed]

ACR Breast Density Category c: The breast tissue is heterogeneously
dense, which may obscure small masses.
FINDINGS: There are no findings suspicious for malignancy.
IMPRESSION: No mammographic evidence of malignancy. A result letter of this
screening mammogram will be mailed directly to the patient.

RECOMMENDATION:
Screening mammogram in one year. (Code:[V2])

BI-RADS CATEGORY  1: Negative.
